# Patient Record
Sex: Female | Born: 2000 | Race: White | Hispanic: Yes | Marital: Single | Smoking: Never smoker
Health system: Southern US, Community
[De-identification: ages and names within clinical notes are randomized; demographics above are authoritative.]

## PROBLEM LIST (undated history)

## (undated) ENCOUNTER — Emergency Department: Admission: EM | Payer: No Typology Code available for payment source | Source: Home / Self Care

## (undated) DIAGNOSIS — D689 Coagulation defect, unspecified: Secondary | ICD-10-CM

## (undated) DIAGNOSIS — F419 Anxiety disorder, unspecified: Secondary | ICD-10-CM

## (undated) DIAGNOSIS — F319 Bipolar disorder, unspecified: Secondary | ICD-10-CM

## (undated) HISTORY — DX: Coagulation defect, unspecified: D68.9

## (undated) HISTORY — DX: Bipolar disorder, unspecified: F31.9

## (undated) HISTORY — DX: Anxiety disorder, unspecified: F41.9

---
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
## 2018-04-26 DIAGNOSIS — R05 Cough: Secondary | ICD-10-CM | POA: Diagnosis not present

## 2018-11-08 DIAGNOSIS — Z113 Encounter for screening for infections with a predominantly sexual mode of transmission: Secondary | ICD-10-CM | POA: Diagnosis not present

## 2018-11-08 DIAGNOSIS — Z114 Encounter for screening for human immunodeficiency virus [HIV]: Secondary | ICD-10-CM | POA: Diagnosis not present

## 2018-11-18 DIAGNOSIS — F12180 Cannabis abuse with cannabis-induced anxiety disorder: Secondary | ICD-10-CM | POA: Diagnosis not present

## 2018-11-18 DIAGNOSIS — F1020 Alcohol dependence, uncomplicated: Secondary | ICD-10-CM | POA: Diagnosis not present

## 2018-11-18 DIAGNOSIS — F3181 Bipolar II disorder: Secondary | ICD-10-CM | POA: Diagnosis not present

## 2018-11-21 DIAGNOSIS — F411 Generalized anxiety disorder: Secondary | ICD-10-CM | POA: Diagnosis not present

## 2018-11-21 DIAGNOSIS — F3130 Bipolar disorder, current episode depressed, mild or moderate severity, unspecified: Secondary | ICD-10-CM | POA: Diagnosis not present

## 2018-11-21 DIAGNOSIS — F4310 Post-traumatic stress disorder, unspecified: Secondary | ICD-10-CM | POA: Diagnosis not present

## 2018-11-21 DIAGNOSIS — F1020 Alcohol dependence, uncomplicated: Secondary | ICD-10-CM | POA: Diagnosis not present

## 2018-11-26 DIAGNOSIS — F411 Generalized anxiety disorder: Secondary | ICD-10-CM | POA: Diagnosis not present

## 2018-11-26 DIAGNOSIS — F1020 Alcohol dependence, uncomplicated: Secondary | ICD-10-CM | POA: Diagnosis not present

## 2018-11-26 DIAGNOSIS — F3130 Bipolar disorder, current episode depressed, mild or moderate severity, unspecified: Secondary | ICD-10-CM | POA: Diagnosis not present

## 2018-11-26 DIAGNOSIS — F4310 Post-traumatic stress disorder, unspecified: Secondary | ICD-10-CM | POA: Diagnosis not present

## 2018-12-17 DIAGNOSIS — F411 Generalized anxiety disorder: Secondary | ICD-10-CM | POA: Diagnosis not present

## 2018-12-17 DIAGNOSIS — F4310 Post-traumatic stress disorder, unspecified: Secondary | ICD-10-CM | POA: Diagnosis not present

## 2018-12-17 DIAGNOSIS — F1020 Alcohol dependence, uncomplicated: Secondary | ICD-10-CM | POA: Diagnosis not present

## 2018-12-17 DIAGNOSIS — F3130 Bipolar disorder, current episode depressed, mild or moderate severity, unspecified: Secondary | ICD-10-CM | POA: Diagnosis not present

## 2019-01-14 DIAGNOSIS — F4310 Post-traumatic stress disorder, unspecified: Secondary | ICD-10-CM | POA: Diagnosis not present

## 2019-01-14 DIAGNOSIS — F3130 Bipolar disorder, current episode depressed, mild or moderate severity, unspecified: Secondary | ICD-10-CM | POA: Diagnosis not present

## 2019-01-14 DIAGNOSIS — F1020 Alcohol dependence, uncomplicated: Secondary | ICD-10-CM | POA: Diagnosis not present

## 2019-01-14 DIAGNOSIS — F411 Generalized anxiety disorder: Secondary | ICD-10-CM | POA: Diagnosis not present

## 2019-03-06 DIAGNOSIS — L700 Acne vulgaris: Secondary | ICD-10-CM | POA: Diagnosis not present

## 2019-05-23 DIAGNOSIS — A749 Chlamydial infection, unspecified: Secondary | ICD-10-CM

## 2019-05-23 HISTORY — DX: Chlamydial infection, unspecified: A74.9

## 2019-05-27 ENCOUNTER — Encounter (HOSPITAL_COMMUNITY): Payer: Self-pay

## 2019-05-27 ENCOUNTER — Emergency Department (HOSPITAL_COMMUNITY)
Admission: EM | Admit: 2019-05-27 | Discharge: 2019-05-28 | Disposition: A | Discharge status: Home / Self Care | Payer: BC Managed Care – PPO | Attending: Emergency Medicine | Admitting: Emergency Medicine

## 2019-05-27 DIAGNOSIS — Z5321 Procedure and treatment not carried out due to patient leaving prior to being seen by health care provider: Secondary | ICD-10-CM | POA: Diagnosis not present

## 2019-05-27 DIAGNOSIS — R55 Syncope and collapse: Secondary | ICD-10-CM | POA: Diagnosis not present

## 2019-05-27 DIAGNOSIS — N939 Abnormal uterine and vaginal bleeding, unspecified: Secondary | ICD-10-CM | POA: Diagnosis not present

## 2019-05-27 LAB — CBC
HCT: 37.9 % (ref 36.0–46.0)
Hemoglobin: 12.3 g/dL (ref 12.0–15.0)
MCH: 28 pg (ref 26.0–34.0)
MCHC: 32.5 g/dL (ref 30.0–36.0)
MCV: 86.1 fL (ref 80.0–100.0)
Platelets: 197 10*3/uL (ref 150–400)
RBC: 4.4 MIL/uL (ref 3.87–5.11)
RDW: 15.5 % (ref 11.5–15.5)
WBC: 6.9 10*3/uL (ref 4.0–10.5)
nRBC: 0 % (ref 0.0–0.2)

## 2019-05-27 LAB — I-STAT BETA HCG BLOOD, ED (MC, WL, AP ONLY): I-stat hCG, quantitative: <5 m[IU]/mL (ref <5)

## 2019-05-27 NOTE — ED Triage Notes (Signed)
Pt states that she's been on her menstrual cycle for two weeks with heavy bleeding and cramping, pt also states that she passed out at work this afternoon

## 2019-05-28 NOTE — ED Triage Notes (Signed)
Pt not seen in the lobby  

## 2019-08-11 DIAGNOSIS — Z30011 Encounter for initial prescription of contraceptive pills: Secondary | ICD-10-CM | POA: Diagnosis not present

## 2019-10-02 ENCOUNTER — Other Ambulatory Visit (HOSPITAL_COMMUNITY)
Admission: RE | Admit: 2019-10-02 | Discharge: 2019-10-02 | Disposition: A | Discharge status: Home / Self Care | Payer: BC Managed Care – PPO | Source: Ambulatory Visit | Attending: Obstetrics and Gynecology | Admitting: Obstetrics and Gynecology

## 2019-10-02 ENCOUNTER — Ambulatory Visit (INDEPENDENT_AMBULATORY_CARE_PROVIDER_SITE_OTHER): Payer: BC Managed Care – PPO | Admitting: Obstetrics and Gynecology

## 2019-10-02 ENCOUNTER — Encounter: Payer: Self-pay | Admitting: Obstetrics and Gynecology

## 2019-10-02 ENCOUNTER — Other Ambulatory Visit: Payer: Self-pay

## 2019-10-02 VITALS — BP 130/66 | HR 88 | Temp 97.7°F | Resp 20 | Ht 65.5 in | Wt 118.8 lb

## 2019-10-02 DIAGNOSIS — N921 Excessive and frequent menstruation with irregular cycle: Secondary | ICD-10-CM

## 2019-10-02 DIAGNOSIS — Z113 Encounter for screening for infections with a predominantly sexual mode of transmission: Secondary | ICD-10-CM | POA: Diagnosis not present

## 2019-10-02 DIAGNOSIS — Z01419 Encounter for gynecological examination (general) (routine) without abnormal findings: Secondary | ICD-10-CM

## 2019-10-02 LAB — POCT URINE PREGNANCY: Preg Test, Ur: NEGATIVE

## 2019-10-02 MED ORDER — DROSPIRENONE-ETHINYL ESTRADIOL 3-0.03 MG PO TABS: 1.0000 | ORAL_TABLET | Freq: Every day | ORAL | 3 refills | Status: AC

## 2019-10-02 NOTE — Progress Notes (Signed)
19 y.o. G0,P0,L0. Single Caucasian female here for annual exam.    Patient began Yaz 2 months ago and has had irregular bleeding since.  She has heavy and painful menses.  Menarche age 19 yo.  She has used pills off and on since age 19 yo. She used Junel Fe in the past and it helped her periods.  She had acne and then started Yaz.   Taking a pill is the best option for her.  She has bipolar disorder.  She does not currently treat this.  She may start Lamictal in a month or two.   She is searching for psychiatrist to prescribe.  She denies hx of clotting disorder.  No personal or family history of thromboembolic events. Denies migraine with aura, HTN, liver or breast disease.  She had prolonged menstrual bleeding in January.  She went to the hospital but left AMA.   She was having a manic episode at at the same time.   She tested for STDs at [facility] and was negative in December.  She really wants a pap today.  Turns 19 years old in a couple of days.   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  PCP:  None   No LMP recorded.     Period Cycle (Days): 30(regular up to 05/2019) Period Duration (Days): 3-4 days Period Pattern: Regular Menstrual Flow: Heavy Menstrual Control: Tampon Menstrual Control Change Freq (Hours): every hour on heaviest day Dysmenorrhea: (!) Severe Dysmenorrhea Symptoms: Cramping, Headache, Diarrhea, Nausea     Sexually active: Yes.    The current method of family planning is OCP (estrogen/progesterone)--Yaz.    Exercising: No.  The patient does not participate in regular exercise at present. Smoker:  Smokes Marijuana daily  Health Maintenance: Pap:  n/a History of abnormal Pap:  n/a MMG:  n/a Colonoscopy:  n/a BMD:   n/a  Result  n/a TDaP:  Unsure Gardasil:   Unsure HIV: Neg 11/2018 Hep C:Neg 11/2018 Screening Labs:  PCP.    reports that she has never smoked. She has never used smokeless tobacco. She reports  current drug use. Frequency: 7.00 times per week. Drug: Marijuana. She reports that she does not drink alcohol.  Past Medical History:  Diagnosis Date  . Anxiety   . Bipolar 1 disorder ([place])   . Clotting disorder ([place])     No past surgical history on file.  Current Outpatient Medications  Medication Sig Dispense Refill  . drospirenone-ethinyl estradiol (YAZ) 3-0.02 MG tablet Take 1 tablet by mouth daily.    Marland Kitchen ibuprofen (ADVIL) 200 MG tablet Take 200 mg by mouth every 6 (six) hours as needed for moderate pain.     No current facility-administered medications for this visit.    Family History  Problem Relation Age of Onset  . Breast cancer Maternal Grandmother   . Diabetes Maternal Grandmother   . Diabetes Maternal Grandfather   . Hypertension Maternal Grandfather     Review of Systems  All other systems reviewed and are negative.   Exam:   BP 130/66   Pulse 88   Temp 97.7 F (36.5 C) (Temporal)   Resp 20   Ht 5' 5.5" (1.664 m)   Wt 118 lb 12.8 oz (53.9 kg)   BMI 19.47 kg/m     General appearance: alert, cooperative and appears stated age Head: normocephalic, without obvious abnormality, atraumatic Neck: no adenopathy, supple, symmetrical, trachea midline and thyroid normal to inspection and palpation Lungs: clear  to auscultation bilaterally Breasts: normal appearance, no masses or tenderness, No nipple retraction or dimpling, No nipple discharge or bleeding, No axillary adenopathy Heart: regular rate and rhythm Abdomen: soft, non-tender; no masses, no organomegaly Extremities: extremities normal, atraumatic, no cyanosis or edema Skin: skin color, texture, turgor normal. No rashes or lesions Lymph nodes: cervical, supraclavicular, and axillary nodes normal. Neurologic: grossly normal  Pelvic: External genitalia:  no lesions              No abnormal inguinal nodes palpated.              Urethra:  normal appearing urethra with no masses, tenderness or lesions               Bartholins and Skenes: normal                 Vagina: normal appearing vagina with normal color and discharge, no lesions              Cervix: no lesions              Pap taken: Yes.   Bimanual Exam:  Uterus:  normal size, contour, position, consistency, mobility, non-tender              Adnexa: no mass, fullness, tenderness         Chaperone was present for exam.  Assessment:   Well woman visit with normal exam. Clotting disorder denied by patient. Irregular bleeding on Yaz. STD screening.  Plan: Mammogram screening age 19. Self breast awareness reviewed. Pap done. Guidelines for Calcium, Vitamin D, regular exercise program including cardiovascular and weight bearing exercise. We discussed Gardasil vaccine.  She declines routine labs and she accepts STD screening.  Stop Yaz and start Yasmin.  Follow up annually and prn.   After visit summary provided.

## 2019-10-02 NOTE — Patient Instructions (Signed)

## 2019-10-03 LAB — CYTOLOGY - PAP
Chlamydia: POSITIVE — AB
Comment: NEGATIVE
Comment: NEGATIVE
Comment: NORMAL
Diagnosis: NEGATIVE
Diagnosis: REACTIVE
Neisseria Gonorrhea: NEGATIVE
Trichomonas: NEGATIVE

## 2019-10-03 LAB — HEP, RPR, HIV PANEL
HIV Screen 4th Generation wRfx: NONREACTIVE
Hepatitis B Surface Ag: NEGATIVE
RPR Ser Ql: NONREACTIVE

## 2019-10-03 LAB — HEPATITIS C ANTIBODY: Hep C Virus Ab: 0.1 s/co ratio (ref 0.0–0.9)

## 2019-10-05 ENCOUNTER — Encounter: Payer: Self-pay | Admitting: Obstetrics and Gynecology

## 2019-10-06 ENCOUNTER — Telehealth: Payer: Self-pay

## 2019-10-06 MED ORDER — AZITHROMYCIN 250 MG PO TABS: 1000.0000 mg | ORAL_TABLET | Freq: Once | ORAL | 0 refills | Status: AC

## 2019-10-06 MED ORDER — FLUCONAZOLE 150 MG PO TABS: 150.0000 mg | ORAL_TABLET | Freq: Once | ORAL | 0 refills | Status: AC

## 2019-10-06 NOTE — Telephone Encounter (Signed)
Spoke with pt. Pt given results and recommendations per Dr Edward Jolly. Pt agreeable and verbalized understanding. Pt requests EPT. Pt aware that Rx for partner will be at front office for pick up after reviewed and signed by Dr Edward Jolly.  Pt has 3 month follow up OV  scheduled for TOC on 8/19 at 8am. Pt agreeable.  02 recall placed.  Rx sent for both Diflucan and Azithromycin. Pt verbalized understanding. Pharmacy verified.  [facility] form filled out and faxed.     Patton Salles, MD  10/05/2019 3:26 PM EDT    Results to patient through My Chart. Please contact the patient in follow up.  She needs two prescriptions: Azithromycin and Diflucan as noted below.  Offer expedited partner therapy. Retesting appointment with me in 3 months.  Annual exam recall - 02.   Routing to Dr Edward Jolly for review.  Encounter closed.

## 2019-10-06 NOTE — Telephone Encounter (Signed)
Patient is calling in regards to results she received. Patient stated she received a mychart message from the nurse about a prescription. Patient would like to discuss this information.

## 2020-01-07 NOTE — Progress Notes (Deleted)
GYNECOLOGY  VISIT   HPI: 19 y.o.   Single  Caucasian  female   G0P0000 with No LMP recorded.   here for 3 month TOC for chlamydia.   GYNECOLOGIC HISTORY: No LMP recorded. Contraception: ***Yasmin Menopausal hormone therapy:  n/a Last mammogram:  n/a Last pap smear: 10-02-10 Neg        OB History    Gravida  0   Para  0   Term  0   Preterm  0   AB  0   Living  0     SAB  0   TAB  0   Ectopic  0   Multiple  0   Live Births  0              There are no problems to display for this patient.   Past Medical History:  Diagnosis Date  . Anxiety   . Bipolar 1 disorder ([facility])   . Chlamydia infection 2021  . Clotting disorder ([facility])     No past surgical history on file.  Current Outpatient Medications  Medication Sig Dispense Refill  . drospirenone-ethinyl estradiol (YASMIN) 3-0.03 MG tablet Take 1 tablet by mouth daily. 3 Package 3  . ibuprofen (ADVIL) 200 MG tablet Take 200 mg by mouth every 6 (six) hours as needed for moderate pain.     No current facility-administered medications for this visit.     ALLERGIES: Black walnut pollen allergy skin test  Family History  Problem Relation Age of Onset  . Breast cancer Maternal Grandmother   . Diabetes Maternal Grandmother   . Diabetes Maternal Grandfather   . Hypertension Maternal Grandfather     Social History   Socioeconomic History  . Marital status: Single    Spouse name: Not on file  . Number of children: Not on file  . Years of education: Not on file  . Highest education level: Not on file  Occupational History  . Not on file  Tobacco Use  . Smoking status: Never Smoker  . Smokeless tobacco: Never Used  Vaping Use  . Vaping Use: Former  . Quit date: 09/11/2019  Substance and Sexual Activity  . Alcohol use: No  . Drug use: Yes    Frequency: 7.0 times per week    Types: Marijuana    Comment: uses daily  . Sexual activity: Not Currently    Birth control/protection: I.U.D.    Comment:  Yaz  Other Topics Concern  . Not on file  Social History Narrative  . Not on file   Social Determinants of Health   Financial Resource Strain:   . Difficulty of Paying Living Expenses:   Food Insecurity:   . Worried About Programme researcher, broadcasting/film/video in the Last Year:   . Barista in the Last Year:   Transportation Needs:   . Freight forwarder (Medical):   Marland Kitchen Lack of Transportation (Non-Medical):   Physical Activity:   . Days of Exercise per Week:   . Minutes of Exercise per Session:   Stress:   . Feeling of Stress :   Social Connections:   . Frequency of Communication with Friends and Family:   . Frequency of Social Gatherings with Friends and Family:   . Attends Religious Services:   . Active Member of Clubs or Organizations:   . Attends Banker Meetings:   Marland Kitchen Marital Status:   Intimate Partner Violence:   . Fear of Current or Ex-Partner:   .  Emotionally Abused:   Marland Kitchen Physically Abused:   . Sexually Abused:     Review of Systems  PHYSICAL EXAMINATION:    There were no vitals taken for this visit.    General appearance: alert, cooperative and appears stated age Head: Normocephalic, without obvious abnormality, atraumatic Neck: no adenopathy, supple, symmetrical, trachea midline and thyroid normal to inspection and palpation Lungs: clear to auscultation bilaterally Breasts: normal appearance, no masses or tenderness, No nipple retraction or dimpling, No nipple discharge or bleeding, No axillary or supraclavicular adenopathy Heart: regular rate and rhythm Abdomen: soft, non-tender, no masses,  no organomegaly Extremities: extremities normal, atraumatic, no cyanosis or edema Skin: Skin color, texture, turgor normal. No rashes or lesions Lymph nodes: Cervical, supraclavicular, and axillary nodes normal. No abnormal inguinal nodes palpated Neurologic: Grossly normal  Pelvic: External genitalia:  no lesions              Urethra:  normal appearing urethra  with no masses, tenderness or lesions              Bartholins and Skenes: normal                 Vagina: normal appearing vagina with normal color and discharge, no lesions              Cervix: no lesions                Bimanual Exam:  Uterus:  normal size, contour, position, consistency, mobility, non-tender              Adnexa: no mass, fullness, tenderness              Rectal exam: {yes no:[id number]}.  Confirms.              Anus:  normal sphincter tone, no lesions  Chaperone was present for exam.  ASSESSMENT     PLAN     An After Visit Summary was printed and given to the patient.  ______ minutes face to face time of which over 50% was spent in counseling.

## 2020-01-08 ENCOUNTER — Encounter: Payer: Self-pay | Admitting: Obstetrics and Gynecology

## 2020-01-08 ENCOUNTER — Ambulatory Visit: Payer: Self-pay | Admitting: Obstetrics and Gynecology

## 2020-02-23 DIAGNOSIS — F4310 Post-traumatic stress disorder, unspecified: Secondary | ICD-10-CM | POA: Diagnosis not present

## 2020-02-23 DIAGNOSIS — F411 Generalized anxiety disorder: Secondary | ICD-10-CM | POA: Diagnosis not present

## 2020-02-23 DIAGNOSIS — F339 Major depressive disorder, recurrent, unspecified: Secondary | ICD-10-CM | POA: Diagnosis not present

## 2020-03-21 DIAGNOSIS — J029 Acute pharyngitis, unspecified: Secondary | ICD-10-CM | POA: Diagnosis not present

## 2020-06-17 ENCOUNTER — Emergency Department: Payer: BC Managed Care – PPO

## 2020-06-17 ENCOUNTER — Other Ambulatory Visit: Payer: Self-pay

## 2020-06-17 ENCOUNTER — Encounter: Payer: Self-pay | Admitting: Emergency Medicine

## 2020-06-17 ENCOUNTER — Emergency Department
Admission: EM | Admit: 2020-06-17 | Discharge: 2020-06-17 | Disposition: A | Discharge status: Home / Self Care | Payer: BC Managed Care – PPO | Attending: Student | Admitting: Student

## 2020-06-17 DIAGNOSIS — S0990XA Unspecified injury of head, initial encounter: Secondary | ICD-10-CM | POA: Diagnosis not present

## 2020-06-17 DIAGNOSIS — W000XXA Fall on same level due to ice and snow, initial encounter: Secondary | ICD-10-CM | POA: Diagnosis not present

## 2020-06-17 DIAGNOSIS — R1110 Vomiting, unspecified: Secondary | ICD-10-CM | POA: Insufficient documentation

## 2020-06-17 DIAGNOSIS — H538 Other visual disturbances: Secondary | ICD-10-CM | POA: Insufficient documentation

## 2020-06-17 DIAGNOSIS — Z5321 Procedure and treatment not carried out due to patient leaving prior to being seen by health care provider: Secondary | ICD-10-CM | POA: Diagnosis not present

## 2020-06-17 NOTE — ED Notes (Signed)
No answer when called several times from lobby 

## 2020-06-17 NOTE — ED Notes (Signed)
Called 1x no answer. 

## 2020-06-17 NOTE — ED Notes (Signed)
Pt called 2nd time no answer 

## 2020-06-17 NOTE — ED Provider Notes (Cosign Needed)
MSE was initiated and I personally evaluated the patient and placed orders (if any) at  5:43 PM on June 17, 2020.  Pt states she slipped and fell on ice 3 days ago, hitting her head. Today, woke up and has had several episodes of repetitive vomiting and feels that her vision is blurry if she tries to read. No abdominal symptoms, no covid exposure. No hx of head injury. ? Clotting disorder per previous record patient unsure about this. Neuro exam grossly intact. Ct head ordered. Patient stable at this time.   The patient appears stable so that the remainder of the MSE may be completed by another provider.   Lucy Chris, [place] 06/17/20 1744

## 2020-06-17 NOTE — ED Triage Notes (Signed)
Pt comes into the ED via POV c/o fall 3 days ago.  Pt states she slipped on the ice and fell and hit her head.  Pt asymptomatic for 2 days until today when she started vomiting and having blurred vision.  Pt has even and unlabored respirations and in NAD.

## 2020-06-17 NOTE — ED Notes (Signed)
No answer when called several times from lobby & cell phone # listed in chart 

## 2020-06-17 NOTE — ED Notes (Signed)
No answer in lobby, flex wait, ct scan or outside when called for a room

## 2020-07-22 ENCOUNTER — Ambulatory Visit: Payer: BC Managed Care – PPO | Admitting: Nurse Practitioner

## 2020-08-09 ENCOUNTER — Telehealth: Payer: Self-pay | Admitting: General Practice

## 2020-08-09 ENCOUNTER — Encounter: Payer: Self-pay | Admitting: Nurse Practitioner

## 2020-08-09 NOTE — Telephone Encounter (Signed)
Pt was no show for appt 07/22/2020 for new pt. 1st occurrence. Fee waived. Letter mailed.

## 2020-08-22 DIAGNOSIS — Z20822 Contact with and (suspected) exposure to covid-19: Secondary | ICD-10-CM | POA: Diagnosis not present

## 2020-08-22 DIAGNOSIS — Z03818 Encounter for observation for suspected exposure to other biological agents ruled out: Secondary | ICD-10-CM | POA: Diagnosis not present

## 2022-03-03 IMAGING — CT CT HEAD W/O CM
3 series · 16 of 47 positions shown, 19 images · non-contrast
Comparison: None.

CLINICAL DATA: Head trauma

EXAM:
CT HEAD WITHOUT CONTRAST
TECHNIQUE: Contiguous axial images were obtained from the base of the skull
through the vertex without intravenous contrast.

[Series 2: head wo · axial · 0.42mm/px · z∈[-169,-44]mm · 10 of 31 slices shown, 13 images]
[im 3/31  brain]
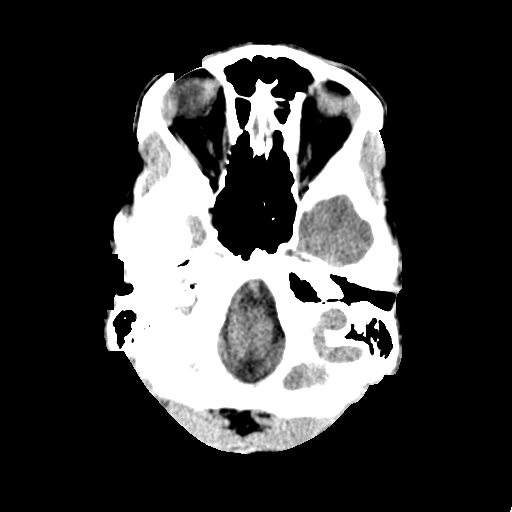
[im 3/31  bone]
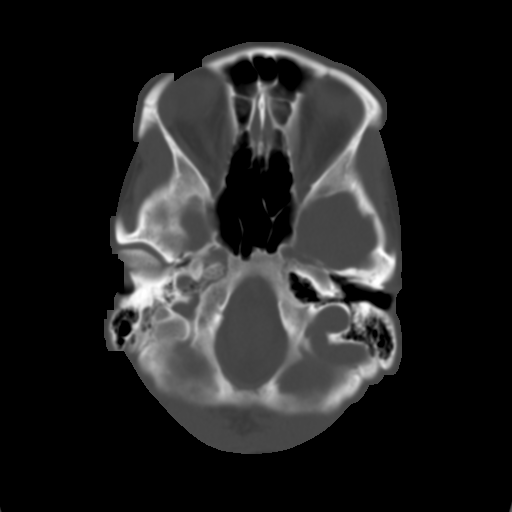
[im 6/31  brain]
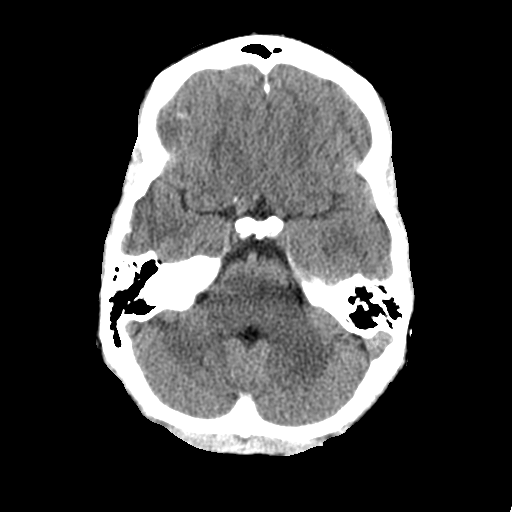
[im 9/31  brain]
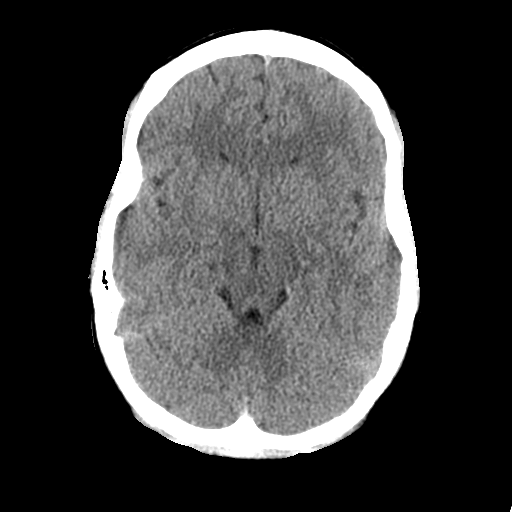
[im 11/31  brain]
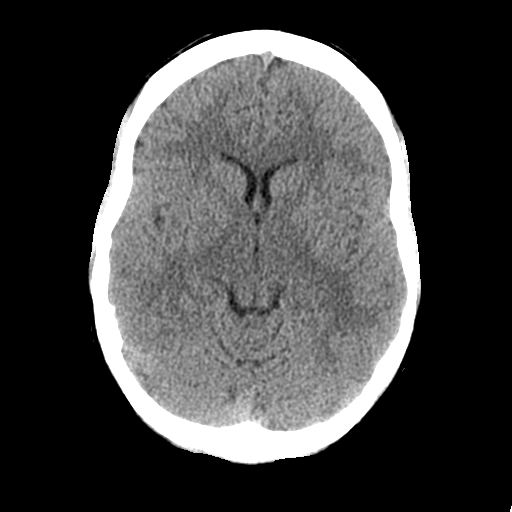
[im 14/31  brain]
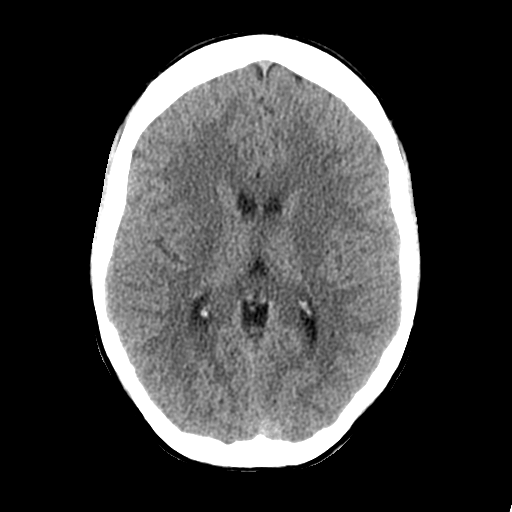
[im 14/31  bone]
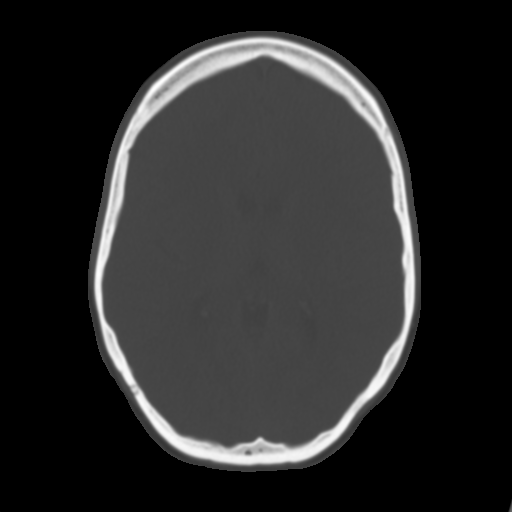
[im 17/31  brain]
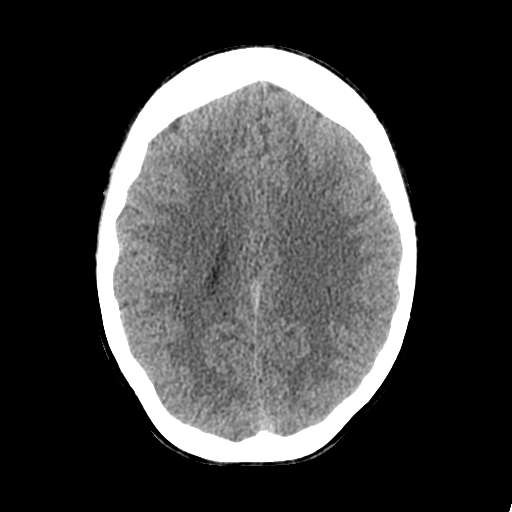
[im 20/31  brain]
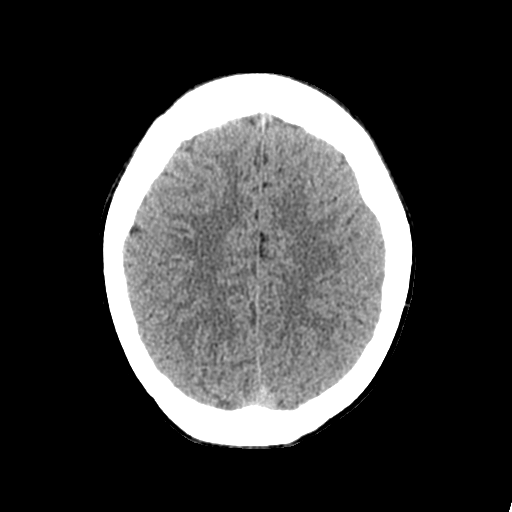
[im 23/31  brain]
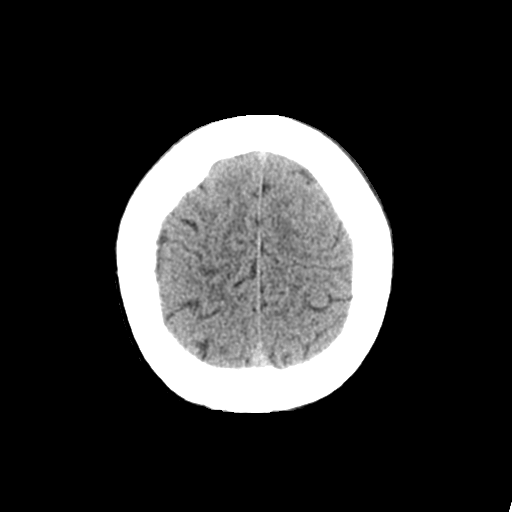
[im 25/31  brain]
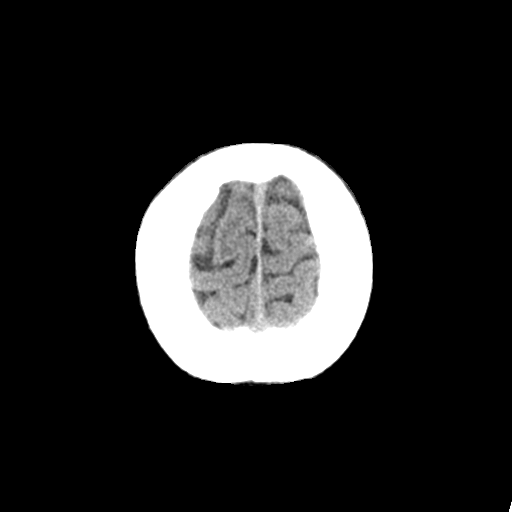
[im 25/31  bone]
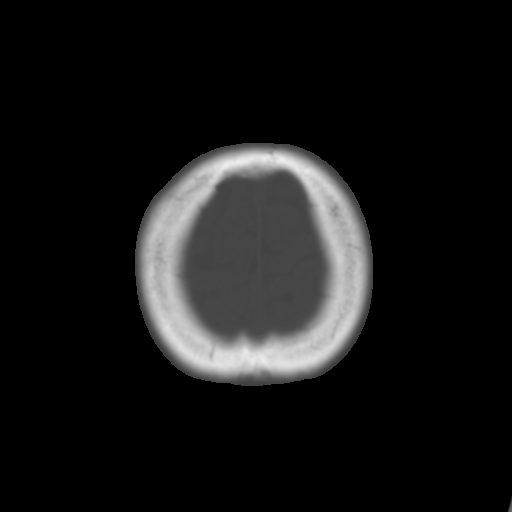
[im 28/31  brain]
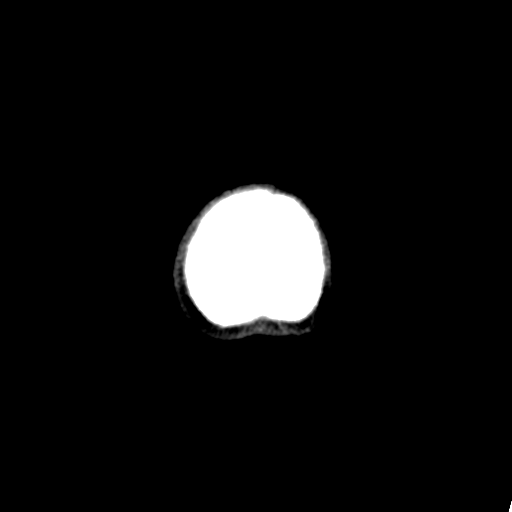

[Series 4: coronal soft tissue · coronal · 0.31mm/px · 3 of 64 slices shown]
[im 22/64  brain]
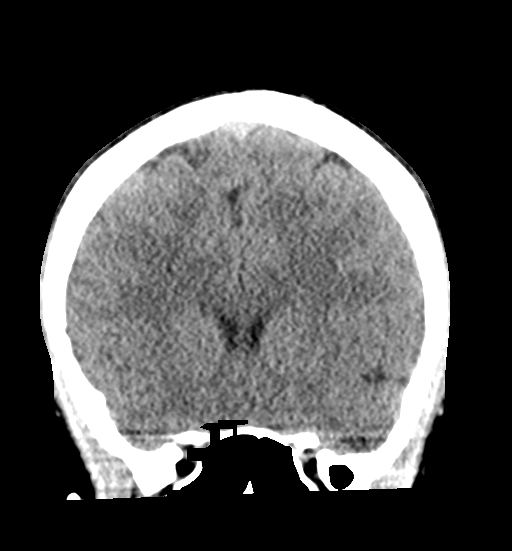
[im 29/64  brain]
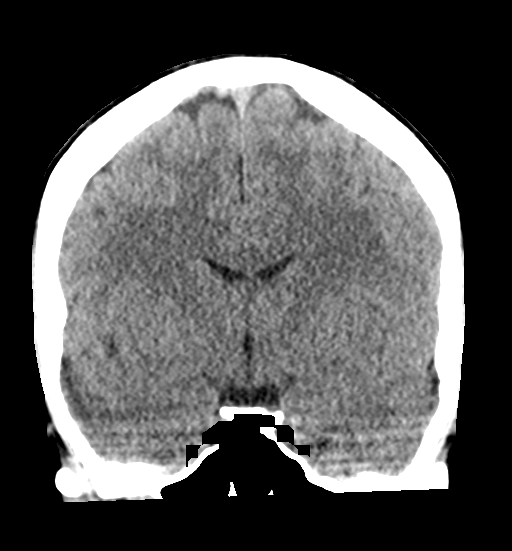
[im 36/64  brain]
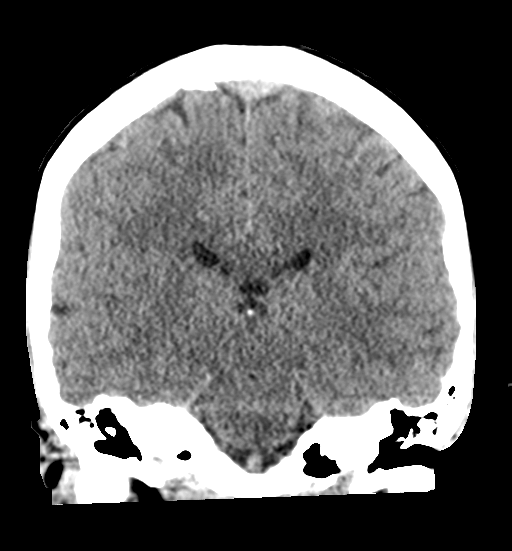

[Series 5: sagittal soft tissue · sagittal · 0.33mm/px · 3 of 50 slices shown]
[im 17/50  brain]
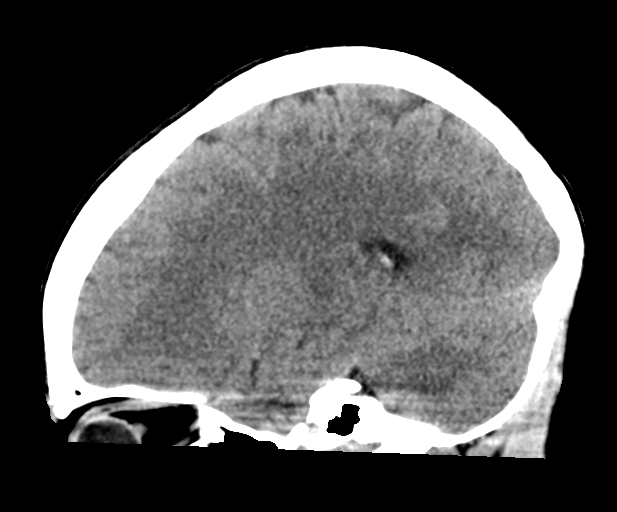
[im 25/50  brain]
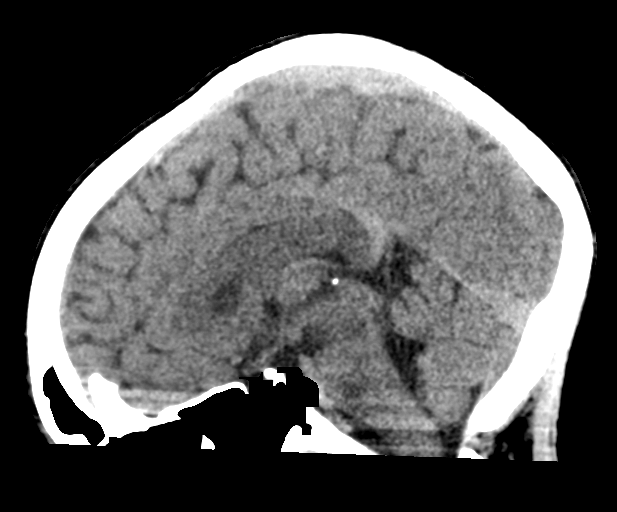
[im 33/50  brain]
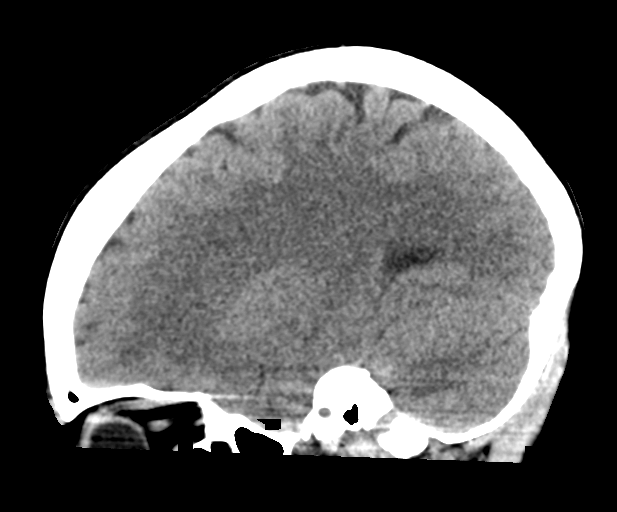

[16 of 47 positions shown; findings below may reference images not displayed]

FINDINGS: Brain: No acute territorial infarction, hemorrhage or intracranial
mass. The ventricles are nonenlarged

Vascular: No hyperdense vessel or unexpected calcification.

Skull: Normal. Negative for fracture or focal lesion.

Sinuses/Orbits: No acute finding.

Other: None
IMPRESSION: Negative non contrasted CT appearance of the brain.
